# Patient Record
Sex: Female | Born: 1970 | Race: White | Hispanic: No | Marital: Single
Health system: Midwestern US, Community
[De-identification: ages and names within clinical notes are randomized; demographics above are authoritative.]

---

## 2009-06-11 ENCOUNTER — Emergency Department (HOSPITAL_COMMUNITY): Admission: EM | Admit: 2009-06-11 | Discharge: 2009-06-11 | Payer: Self-pay | Admitting: Family Medicine

## 2009-11-23 NOTE — ED Provider Notes (Signed)
KNOWN ALLERGIES   NKDA       TRIAGE   TRIAGE NOTES:  was running on treadmill and felt a snapping and         a pop and now having lots of right ankle swelling and pain skin warm         pink         dry resp even and unlabored. pt unable to bear weight on extremity.   PATIENT: NAME: Colleen Davenport, AGE: 39, GENDER: female, DOB:         Fri [birth year], TIME OF GREET: Sun Nov 23, 2009 14:07, SSN:         [id number], KG WEIGHT: 86.2, HEIGHT: 165cm, MEDICAL RECORD NUMBER:         [phone], ACCOUNT NUMBER: [id number], PCP: Mccurdy,Bruce,.   ADMISSION: URGENCY: 4, DEPT: Emergency, BED: WAITING.   VITAL SIGNS: BP 129/60, (Sitting), Pulse 63, Resp 20, Temp 96.7,         (Oral), Pain 9, O2 Sat 98, on Room air, Time 11/23/2009 14:09.   COMPLAINT:  Foot Injury.   LMP: Last menstrual period: 11/19/2009.   TB SCREENING: TB screen negative for this patient.   ABUSE SCREENING: Patient denies physical abuse or threats.   FALL RISK: Patient has a low risk of falling.   SUICIDAL IDEATION: Suicidal ideation is not present.   ADVANCE DIRECTIVES: Patient does not have advance directives,         Triage assessment performed.   PROVIDERS: TRIAGE NURSE: Georgeanne Nim, RN.       CURRENT MEDICATIONS   Wellbutrin:  150 mg Oral once a day.       MEDICATION SERVICE   Ibuprofen:  Order: Ibuprofen - Dose: 800 mg : Oral         Ordered by: Dixie Dials, MD         Entered by: Dixie Dials, MD Sun Nov 23, 2009 14:28 ,          Acknowledged by: Lucita Lora, RN Sun Nov 23, 2009 14:35         Documented as given by: Lucita Lora, RN Sun Nov 23, 2009 14:38          Patient, Medication, Dose, Route and Time verified prior to         administration.          Time given: 1440, Amount given: 800mg , Site: Medication administered         P.O., Correct patient, time, route, dose and medication confirmed         prior to administration, Patient advised of actions and side-effects         prior to administration, Allergies confirmed and medications reviewed          prior to administration.   Percocet 5/325:  Order: Percocet 5/325 (Acetaminophen/Oxycodone         Hydrochloride) - Dose: 325/5 mg : Oral         POTENTIAL SEVERE INTERACTION: Wellbutrin - Physician Discretion -         Benefits outweigh the risk         Ordered by: Dixie Dials, MD         Entered by: Dixie Dials, MD Sun Nov 23, 2009 14:28 ,          Acknowledged by: Lucita Lora, RN Sun Nov 23, 2009 14:35         Documented as given by: Lucita Lora, RN Wynelle Link  Nov 23, 2009 14:39          Patient, Medication, Dose, Route and Time verified prior to         administration.          Time given: 1440, Amount given: 1tab, Site: Medication administered         P.O., Correct patient, time, route, dose and medication confirmed         prior to administration, Patient advised of actions and side-effects         prior to administration, Allergies confirmed and medications reviewed         prior to administration.       INSTRUCTION   FOLLOWUP:  NEFF, ERIC [facility], [id number],         [place] [address], [phone], [facility] [place], , MEDICINE.   SPECIAL:  splint, ice, crutches, and pain medication as needed.         follow-up with orthopedics once home. return for increased pain or         other concerns.   Key:     BAF=Fickenscher, MD, Romeo Apple  JLP1=Guy, RN, Victorino Dike  JOHO=Hubbard, PA-C,     Amil Amen     KMC=Clinton, RN, Tresa Endo

## 2009-11-23 NOTE — ED Provider Notes (Signed)
[facility]   EMERGENCY DEPARTMENT TREATMENT REPORT   NAME:  Colleen Davenport   SEX:   F   ADMIT: 11/23/2009   DOB:   22-Dec-1970   MR#    [id number]   ROOM:     TIME SEEN: 03 11 PM   ACCT#  [id number]       cc: Scarlett Presto MD       CHIEF COMPLAINT:   Foot injury.       HISTORY OF PRESENT ILLNESS:   A 39 year old female who was running on the treadmill.  She states she heard a    pop, had immediate right foot pain and fell off the treadmill.  She denies    any injuries except her foot.  She states it hurts to move it.  She states    that the pain is essentially in the area of her Achilles and radiates into her    ankle.  She rates her pain as a 9 out of 10.  She has not taken anything for    it.       REVIEW OF SYSTEMS:   No knee pain, no hip pain.     NEUROLOGICALLY:  No neurologic symptoms.       PAST MEDICAL HISTORY:   None.       MEDICATIONS:   Wellbutrin.       ALLERGIES:   NONE.       PHYSICAL EXAMINATION:   VITAL SIGNS:  Blood pressure 129/60, pulse 63, respiratory rate 20,    temperature 96.7, O2 sat 98% on room air.   GENERAL APPEARANCE:  The patient appears well developed and well nourished.      Appearance and behavior are age and situation appropriate.   PULMONARY: She is not in respiratory distress.     NEUROLOGICALLY:  She is intact distally.   VASCULARLY:  She has 2+ dorsalis pedis and posterior tibial pulses.     There is significant tenderness to palpation of the Achilles and both    malleoli.  No tenderness in the foot.  Squeeze of her calf causes plantar    flexion of her foot, suggesting an intact Achilles tendon.       INITIAL ASSESSMENT:   A patient with an ankle injury, perhaps partial Achilles tear versus an ankle    sprain versus fracture.  This is a new problem for this patient.  Old records    were reviewed.  No additional relevant information was obtained.        CONTINUATION BY DR. FICKENSCHER:       RESULTS:     Ankle x-ray is negative.         MEDICAL DECISION MAKING:   The  patient likely has a partial Achilles tendon tear.  Will place her in a    posterior OCL, put her on crutches and refer to orthopedics.  She has been    given Percocet and ibuprofen here.  Discharged with a prescription for Vicodin    and ibuprofen.       DIAGNOSIS:   Acute partial Achilles tendon tear.           ___________________   Christiana Pellant MD   Dictated By: .        D:11/23/2009   T: 11/24/2009 12:15:00   [id number]

## 2010-01-06 NOTE — ED Provider Notes (Signed)
KNOWN ALLERGIES   NKDA       TRIAGE   TRIAGE NOTES:  (L) hand vs. van door.   PATIENT: NAME: Colleen Davenport, AGE: 39, GENDER: female,         DOB: Fri 1970/10/27, TIME OF GREET: Tue Jan 06, 2010 23:46, SSN:         [id number], KG WEIGHT: 86.2 (est.), HEIGHT: 165cm, MEDICAL RECORD         NUMBER: [phone], ACCOUNT NUMBER: [id number], PCP: Pt Denied,.   ADMISSION: URGENCY: 4, DEPT: Emergency, BED: WAITING.   VITAL SIGNS: BP 132/70, (Sitting), Pulse 60, Resp 18, Temp 97.2,         (Oral), Pain 9, O2 Sat 100, on Room air, Time 01/06/2010 23:56.   COMPLAINT:  Hurt Left Hand.   PRESENTING COMPLAINT:  Lt hand injury.   PAIN: Patient complains of pain, Pain described as throbbing, On         a scale 0-10 patient rates pain as 9, Pain is constant.   LMP: Last menstrual period: 12-06-2009.   TRIAGE CARE: Sling applied.   TB SCREENING: TB screen negative for this patient.   ABUSE SCREENING: Patient denies physical abuse or threats.   FALL RISK: Fall risk assessment not applicable to this patient.   SUICIDAL IDEATION: Suicidal ideation is not present.   ADVANCE DIRECTIVES: Patient does not have advance directives,         Triage assessment performed.   PROVIDERS: TRIAGE NURSE: Harolyn Rutherford, RN.   PREVIOUS VISIT ALLERGIES: Nkda.       CURRENT MEDICATIONS   Wellbutrin:  150 mg Oral once a day.       MEDICATION SERVICE   Percocet 5/325:  Order: Percocet 5/325 (Acetaminophen/Oxycodone         Hydrochloride) - Dose: 325/5 mg : Oral         POTENTIAL SEVERE INTERACTION: Wellbutrin - Low risk interaction         Notes: 2 po         Ordered by: Hilaria Ota, PA-C         Entered by: Hilaria Ota, PA-C Wed Jan 07, 2010 00:12          Documented as given by: Odessa Fleming, RN Wed Jan 07, 2010 00:33          Patient, Medication, Dose, Route and Time verified prior to         administration.          Time given: 0034, Amount given: 2 tabs, Site: Medication         administered P.O., Correct patient, time, route, dose and  medication         confirmed prior to administration, Patient advised of actions and         side-effects prior to administration, Allergies confirmed and         medications reviewed prior to administration, Administered by         PSmith,RN, Patient in position of comfort, Side rails up, Cart in         lowest position, Call light in reach.       INSTRUCTION   DISCHARGE:  CONTUSION (BRUISE, ECCHYMOSIS).   FOLLOWUP:  [facility], , MEDICINE, HADEN, DAVID S, GMD, [address]         [address], [place] [address], [phone].   SPECIAL:  Xrays will be read by radiologist in 24 hrs. You will  be notified of any change.         Follow up with primary care physician         Take 600 mg (3 tabs) ibuprofen OTC 3 times daily for inflammation         Ice and elevate.   Key:     KM5=Mullaney, RN, Tresa Endo  NVS=Santiago, PA-C, Delton See  PTS0=Smith, RN,     [employer]

## 2010-01-06 NOTE — ED Provider Notes (Signed)
[facility]   EMERGENCY DEPARTMENT TREATMENT REPORT   NAME:  Colleen Davenport   SEX:   F   ADMIT: 01/06/2010   DOB:   21-Mar-1970   MR#    [id number]   ROOM:     TIME SEEN: 04 26 AM   ACCT#  [id number]               CHIEF COMPLAINT:   Left hand injury.       HISTORY OF PRESENT ILLNESS:   A 39 year old female who arrived to the Emergency Department, apparently her    son accidentally closed the door on her and her left hand is bruised and    painful, is able to move it, but says it hurts.  No wrist injury.       REVIEW OF SYSTEMS:   Moderate bruising to the dorsal aspect of the left hand and painful to move.     No other injuries.  Denies complaints in all other systems.         PAST MEDICAL HISTORY:   Depression.       FAMILY HISTORY:    Negative.        SOCIAL HISTORY:   Negative.       ALLERGIES:   NO KNOWN DRUG ALLERGIES.       MEDICATIONS:   Wellbutrin.       PHYSICAL EXAMINATION:   VITAL SIGNS:  Blood pressure 132/70, pulse 60, respirations 18, temperature    97.0, pain 9 out of 10, O2 saturation 100%.     MUSCULOSKELETAL:  Examination of patient's left hand, dorsal aspect of the    left hand has increased bruising and swelling.  She is able to flex and extend    it.       COURSE IN THE EMERGENCY DEPARTMENT:      X-rays were done, they were negative.       FINAL DIAGNOSIS:   Acute left hand contusion.         DISPOSITION AND PLAN:      The patient was sent home on Percocet and Norvasc.  The patient was personally    evaluated by myself and Dr. Erlinda Hong who agrees with the above    assessment and plan.           ___________________   Erlinda Hong MD   Dictated By: Hilaria Ota, PA-C   tc   D:01/08/2010   T: 01/08/2010 08:51:06   [id number]

## 2010-01-07 LAB — URINALYSIS W/ RFLX MICROSCOPIC
Bilirubin: NEGATIVE
Blood: NEGATIVE
Glucose: NEGATIVE MG/DL
Ketone: NEGATIVE MG/DL
Leukocyte Esterase: NEGATIVE
Nitrites: NEGATIVE
Protein: NEGATIVE MG/DL
Specific gravity: 1.01 (ref 1.003–1.030)
Urobilinogen: 0.2 EU/DL (ref 0.2–1.0)
pH (UA): 5.5 (ref 5.0–8.0)

## 2010-01-07 LAB — HCG URINE, QL: HCG urine, QL: NEGATIVE

## 2011-08-26 ENCOUNTER — Emergency Department: Payer: Self-pay | Admitting: Emergency Medicine

## 2011-08-26 LAB — COMPREHENSIVE METABOLIC PANEL
Alkaline Phosphatase: 62 U/L (ref 50–136)
Anion Gap: 11 (ref 7–16)
Calcium, Total: 8.2 mg/dL — ABNORMAL LOW (ref 8.5–10.1)
Chloride: 111 mmol/L — ABNORMAL HIGH (ref 98–107)
Co2: 22 mmol/L (ref 21–32)
EGFR (African American): >60
EGFR (Non-African Amer.): >60
Osmolality: 285 (ref 275–301)
Potassium: 3.6 mmol/L (ref 3.5–5.1)
Sodium: 144 mmol/L (ref 136–145)

## 2011-08-26 LAB — TSH: Thyroid Stimulating Horm: 0.461 u[IU]/mL

## 2011-08-26 LAB — CBC
Platelet: 264 10*3/uL (ref 150–440)
RBC: 4.27 10*6/uL (ref 3.80–5.20)
WBC: 9.3 10*3/uL (ref 3.6–11.0)

## 2011-08-26 LAB — URINALYSIS, COMPLETE
Bacteria: NONE SEEN
Blood: NEGATIVE
Glucose,UR: NEGATIVE mg/dL (ref 0–75)
Ketone: NEGATIVE
Specific Gravity: 1.002 (ref 1.003–1.030)
Squamous Epithelial: NONE SEEN
WBC UR: NONE SEEN /HPF (ref 0–5)

## 2011-08-26 LAB — DRUG SCREEN, URINE
Barbiturates, Ur Screen: NEGATIVE (ref ?–200)
Cannabinoid 50 Ng, Ur ~~LOC~~: NEGATIVE (ref ?–50)
Cocaine Metabolite,Ur ~~LOC~~: NEGATIVE (ref ?–300)
Methadone, Ur Screen: NEGATIVE (ref ?–300)
Tricyclic, Ur Screen: NEGATIVE (ref ?–1000)

## 2011-08-26 LAB — ETHANOL
Ethanol %: 0.201 % — ABNORMAL HIGH (ref 0.000–0.080)
Ethanol: 201 mg/dL

## 2011-08-27 ENCOUNTER — Emergency Department (HOSPITAL_COMMUNITY)
Admission: EM | Admit: 2011-08-27 | Discharge: 2011-08-29 | Disposition: A | Discharge status: Other Acute Inpatient Hospital | Payer: Self-pay | Attending: Emergency Medicine | Admitting: Emergency Medicine

## 2011-08-27 ENCOUNTER — Encounter (HOSPITAL_COMMUNITY): Payer: Self-pay | Admitting: *Deleted

## 2011-08-27 DIAGNOSIS — F1010 Alcohol abuse, uncomplicated: Secondary | ICD-10-CM | POA: Insufficient documentation

## 2011-08-27 LAB — COMPREHENSIVE METABOLIC PANEL
AST: 22 U/L (ref 0–37)
Albumin: 3.7 g/dL (ref 3.5–5.2)
Calcium: 9.1 mg/dL (ref 8.4–10.5)
Creatinine, Ser: 0.98 mg/dL (ref 0.50–1.10)
Total Protein: 7 g/dL (ref 6.0–8.3)

## 2011-08-27 LAB — CBC WITH DIFFERENTIAL/PLATELET
Basophils Absolute: 0.1 10*3/uL (ref 0.0–0.1)
Basophils Relative: 1 % (ref 0–1)
Eosinophils Absolute: 0.6 10*3/uL (ref 0.0–0.7)
Eosinophils Relative: 5 % (ref 0–5)
HCT: 37.8 % (ref 36.0–46.0)
MCHC: 35.4 g/dL (ref 30.0–36.0)
MCV: 85.5 fL (ref 78.0–100.0)
Monocytes Absolute: 0.9 10*3/uL (ref 0.1–1.0)
Neutro Abs: 6.3 10*3/uL (ref 1.7–7.7)
RDW: 12.7 % (ref 11.5–15.5)

## 2011-08-27 LAB — URINALYSIS, ROUTINE W REFLEX MICROSCOPIC
Glucose, UA: NEGATIVE mg/dL
Ketones, ur: NEGATIVE mg/dL
pH: 5.5 (ref 5.0–8.0)

## 2011-08-27 LAB — RAPID URINE DRUG SCREEN, HOSP PERFORMED
Barbiturates: NOT DETECTED
Benzodiazepines: POSITIVE — AB
Cocaine: NOT DETECTED

## 2011-08-27 LAB — ETHANOL: Alcohol, Ethyl (B): 264 mg/dL — ABNORMAL HIGH (ref 0–11)

## 2011-08-27 LAB — URINE MICROSCOPIC-ADD ON

## 2011-08-27 NOTE — ED Notes (Signed)
The pt wants to be detoxed from xanax and alcohol .  She last used both for 2 hours ago.. lmp due now

## 2011-08-27 NOTE — ED Provider Notes (Signed)
History     CSN: [id number]  Arrival date & time 08/27/11  2111   First MD Initiated Contact with Patient 08/27/11 2308      Chief Complaint  Patient presents with  . detox     (Consider location/radiation/quality/duration/timing/severity/associated sxs/prior treatment) Patient is a 41 y.o. female presenting with drug/alcohol assessment. The history is provided by the patient.  Drug / Alcohol Assessment Primary symptoms comment: wants detox This is a chronic problem. The current episode started more than 1 week ago. The problem has not changed since onset.Suspected agents include alcohol. Associated medical issues include addiction treatment. Associated medical issues do not include withdrawal syndrome.    History reviewed. No pertinent past medical history.  History reviewed. No pertinent past surgical history.  No family history on file.  History  Substance Use Topics  . Smoking status: Never Smoker   . Smokeless tobacco: Not on file  . Alcohol Use: Yes    OB History    Grav Para Term Preterm Abortions TAB SAB Ect Mult Living                  Review of Systems  All other systems reviewed and are negative.    Allergies  Review of patient's allergies indicates no known allergies.  Home Medications   Current Outpatient Rx  Name Route Sig Dispense Refill  . BUPROPION HCL ER (SR) 150 MG PO TB12 Oral Take 150 mg by mouth 2 (two) times daily.    Marland Kitchen ESCITALOPRAM OXALATE 20 MG PO TABS Oral Take 20 mg by mouth daily.      BP 120/74  Temp 98.2 F (36.8 C) (Oral)  Resp 20  SpO2 96%  LMP 07/27/2011  Physical Exam  Constitutional: She is oriented to person, place, and time. She appears well-developed and well-nourished.  HENT:  Head: Normocephalic and atraumatic.  Eyes: Conjunctivae and EOM are normal. Pupils are equal, round, and reactive to light.  Neck: Normal range of motion.  Cardiovascular: Normal rate, regular rhythm and normal heart sounds.     Pulmonary/Chest: Effort normal and breath sounds normal.  Abdominal: Soft. Bowel sounds are normal.  Musculoskeletal: Normal range of motion.  Neurological: She is alert and oriented to person, place, and time.  Skin: Skin is warm and dry.       Diffuse excoriated lesions to arms and face  Psychiatric: She has a normal mood and affect. Her behavior is normal.    ED Course  Procedures (including critical care time)  Labs Reviewed  URINALYSIS, ROUTINE W REFLEX MICROSCOPIC - Abnormal; Notable for the following:    APPearance CLOUDY (*)     Hgb urine dipstick LARGE (*)     Leukocytes, UA MODERATE (*)     All other components within normal limits  URINE RAPID DRUG SCREEN (HOSP PERFORMED) - Abnormal; Notable for the following:    Benzodiazepines POSITIVE (*)     All other components within normal limits  CBC WITH DIFFERENTIAL - Abnormal; Notable for the following:    WBC 11.6 (*)     All other components within normal limits  COMPREHENSIVE METABOLIC PANEL - Abnormal; Notable for the following:    Potassium 3.3 (*)     Glucose, Bld 110 (*)     Total Bilirubin 0.2 (*)     GFR calc non Af Amer 72 (*)     GFR calc Af Amer 83 (*)     All other components within normal limits  URINE MICROSCOPIC-ADD ON -  Abnormal; Notable for the following:    Squamous Epithelial / LPF FEW (*)     Bacteria, UA FEW (*)     All other components within normal limits  PREGNANCY, URINE  ETHANOL   No results found.   No diagnosis found.    MDM  Pt presents for detox evaluation.  Will have seen by behavioral health counselor         Rosanne Ashing, MD 08/27/11 [phone]

## 2011-08-28 MED ORDER — HYDROCORTISONE 1 % EX CREA
TOPICAL_CREAM | Freq: Two times a day (BID) | CUTANEOUS | Status: DC
  Administered 2011-08-28: 17:00:00 via TOPICAL
  Filled 2011-08-28 (×2): qty 28

## 2011-08-28 MED ORDER — SULFAMETHOXAZOLE-TMP DS 800-160 MG PO TABS
1.0000 | ORAL_TABLET | Freq: Once | ORAL | Status: AC
  Administered 2011-08-28: 1 via ORAL
  Filled 2011-08-28: qty 1

## 2011-08-28 MED ORDER — ADULT MULTIVITAMIN W/MINERALS CH
1.0000 | ORAL_TABLET | Freq: Every day | ORAL | Status: DC
  Administered 2011-08-28: 1 via ORAL
  Filled 2011-08-28: qty 1

## 2011-08-28 MED ORDER — ESCITALOPRAM OXALATE 20 MG PO TABS
20.0000 mg | ORAL_TABLET | Freq: Every day | ORAL | Status: DC
  Administered 2011-08-28: 20 mg via ORAL
  Filled 2011-08-28 (×2): qty 1

## 2011-08-28 MED ORDER — THIAMINE HCL 100 MG/ML IJ SOLN: 100.0000 mg | Freq: Every day | INTRAMUSCULAR | Status: DC

## 2011-08-28 MED ORDER — LORAZEPAM 1 MG PO TABS
1.0000 mg | ORAL_TABLET | Freq: Four times a day (QID) | ORAL | Status: DC | PRN
  Administered 2011-08-28 (×2): 1 mg via ORAL
  Filled 2011-08-28 (×2): qty 1

## 2011-08-28 MED ORDER — VITAMIN B-1 100 MG PO TABS
100.0000 mg | ORAL_TABLET | Freq: Every day | ORAL | Status: DC
  Administered 2011-08-28: 100 mg via ORAL
  Filled 2011-08-28: qty 1

## 2011-08-28 MED ORDER — HYDROXYZINE HCL 25 MG PO TABS
25.0000 mg | ORAL_TABLET | Freq: Once | ORAL | Status: AC
  Administered 2011-08-28: 25 mg via ORAL

## 2011-08-28 MED ORDER — FOLIC ACID 1 MG PO TABS
1.0000 mg | ORAL_TABLET | Freq: Every day | ORAL | Status: DC
  Administered 2011-08-28: 1 mg via ORAL
  Filled 2011-08-28: qty 1

## 2011-08-28 MED ORDER — LORAZEPAM 2 MG/ML IJ SOLN: 1.0000 mg | Freq: Four times a day (QID) | INTRAMUSCULAR | Status: DC | PRN

## 2011-08-28 MED ORDER — DEXAMETHASONE SODIUM PHOSPHATE 10 MG/ML IJ SOLN
10.0000 mg | Freq: Once | INTRAMUSCULAR | Status: AC
  Administered 2011-08-28: 10 mg via INTRAMUSCULAR
  Filled 2011-08-28: qty 1

## 2011-08-28 MED ORDER — DIPHENHYDRAMINE HCL 25 MG PO CAPS
25.0000 mg | ORAL_CAPSULE | ORAL | Status: DC | PRN
  Administered 2011-08-28: 25 mg via ORAL
  Filled 2011-08-28: qty 1

## 2011-08-28 MED ORDER — BUPROPION HCL ER (SR) 150 MG PO TB12
150.0000 mg | ORAL_TABLET | Freq: Two times a day (BID) | ORAL | Status: DC
  Administered 2011-08-28: 150 mg via ORAL
  Filled 2011-08-28 (×2): qty 1

## 2011-08-28 NOTE — ED Notes (Signed)
Patient has rash covering body, complaints of itching. Hydrocortisone applied.

## 2011-08-28 NOTE — ED Notes (Signed)
ACT TEAM at bedside for assessment.

## 2011-08-28 NOTE — BH Assessment (Addendum)
Assessment Note   Amber Ramos is an 41 y.o. female who presents seeking detox from alcohol and heroin.  She reports drinking a bottle of wine daily and snorting 2-3 capsules of heroin a few times a week.  She denies SI, Hi, and Psychosis.  This is her first time seeking treatment for substance abuse and she reports she is motivated to get her son back.  She prefers to go to [facility] in [place]. Pt was referred to [facility] as well as [facility]; pending disposition.  Axis I: Alcohol Dependence, Opioid Abuse Axis II: Deferred Axis III: History reviewed. No pertinent past medical history. Axis IV: problems with primary support group Axis V: 31-40 impairment in reality testing  Past Medical History: History reviewed. No pertinent past medical history.  History reviewed. No pertinent past surgical history.  Family History: No family history on file.  Social History:  reports that she has never smoked. She does not have any smokeless tobacco history on file. She reports that she drinks alcohol. Her drug history not on file.  Additional Social History:  Alcohol / Drug Use History of alcohol / drug use?: Yes Substance #1 Name of Substance 1: Wine 1 - Age of First Use: 30 1 - Amount (size/oz): bottle of wine 1 - Frequency: daily 1 - Duration: 8 years 1 - Last Use / Amount: 08/27/11 9pm/ 1 beer  Substance #2 Name of Substance 2: Heroin 2 - Age of First Use: 37 2 - Amount (size/oz): 2-3 capsules snorted 2 - Frequency: few times weekly 2 - Duration: 2 years 2 - Last Use / Amount: 08/23/11 2-3 capsules  CIWA: CIWA-Ar BP: 100/52 mmHg Pulse Rate: 72  Nausea and Vomiting: 3 Tactile Disturbances: mild itching, pins and needles, burning or numbness Tremor: not visible, but can be felt fingertip to fingertip Auditory Disturbances: not present Paroxysmal Sweats: barely perceptible sweating, palms moist Visual Disturbances: very mild sensitivity Anxiety: no anxiety, at ease Headache, Fullness in Head:  very mild Agitation: normal activity Orientation and Clouding of Sensorium: oriented and can do serial additions CIWA-Ar Total: 9  COWS: Clinical Opiate Withdrawal Scale (COWS) Resting Pulse Rate: Pulse Rate 80 or below Sweating: Subjective report of chills or flushing Restlessness: Able to sit still Pupil Size: Pupils possibly larger than normal for room light Bone or Joint Aches: Not present Runny Nose or Tearing: Not present GI Upset: nausea or loose stool Tremor: No tremor Yawning: No yawning Anxiety or Irritability: Patient obviously irritable/anxious Gooseflesh Skin: Skin is smooth COWS Total Score: 6   Allergies: No Known Allergies  Home Medications:  (Not in a hospital admission)  OB/GYN Status:  Patient's last menstrual period was 07/27/2011.  General Assessment Data Location of Assessment: [facility] ED Living Arrangements: Alone Can pt return to current living arrangement?: Yes Admission Status: Voluntary Is patient capable of signing voluntary admission?: Yes Transfer from: Acute Hospital Referral Source: Self/Family/Friend  Education Status Is patient currently in school?: No Highest grade of school patient has completed: masters degree  Risk to self Suicidal Ideation: No Suicidal Intent: No Is patient at risk for suicide?: No Suicidal Plan?: No Access to Means: No What has been your use of drugs/alcohol within the last 12 months?: ongoing Previous Attempts/Gestures: No Intentional Self Injurious Behavior:  (drinking) Family Suicide History: No Persecutory voices/beliefs?: No Depression: No Substance abuse history and/or treatment for substance abuse?: Yes Suicide prevention information given to non-admitted patients: Not applicable  Risk to Others Homicidal Ideation: No Thoughts of Harm to Others: No  Current Homicidal Intent: No Current Homicidal Plan: No Access to Homicidal Means: No History of harm to others?: No Assessment of Violence: None  Noted Does patient have access to weapons?: No Criminal Charges Pending?: No Does patient have a court date: No  Psychosis Hallucinations: None noted Delusions: None noted  Mental Status Report Appear/Hygiene: Disheveled Eye Contact: Good Motor Activity: Freedom of movement Speech: Logical/coherent;Pressured;Slurred Level of Consciousness: Alert Mood: Anxious Affect: Appropriate to circumstance Anxiety Level: Moderate Thought Processes: Coherent;Relevant Judgement: Unimpaired Orientation: Person;Place;Time;Situation Obsessive Compulsive Thoughts/Behaviors: Moderate  Cognitive Functioning Concentration: Decreased Memory: Remote Intact;Recent Intact IQ: Average Insight: Poor Impulse Control: Poor Appetite: Good Sleep: Decreased Total Hours of Sleep:  (couple times a week) Vegetative Symptoms: None  ADLScreening [facility] Assessment Services) Patient's cognitive ability adequate to safely complete daily activities?: Yes Patient able to express need for assistance with ADLs?: Yes  Abuse/Neglect [facility]) Physical Abuse: Denies Verbal Abuse: Denies Sexual Abuse: Denies        ADL Screening (condition at time of admission) Patient's cognitive ability adequate to safely complete daily activities?: Yes Patient able to express need for assistance with ADLs?: Yes       Abuse/Neglect Assessment (Assessment to be complete while patient is alone) Physical Abuse: Denies Verbal Abuse: Denies Sexual Abuse: Denies Exploitation of patient/patient's resources: Denies     Merchant [employer] officer (For Healthcare) Advance Directive: Patient does not have advance directive Nutrition Screen Diet: Regular Unintentional weight loss greater than 10lbs within the last month: No Problems chewing or swallowing foods and/or liquids: No Home Tube Feeding or Total Parenteral Nutrition (TPN): No  Additional Information 1:1 In Past 12 Months?: No CIRT Risk: No Elopement Risk: No Does patient  have medical clearance?: No     Disposition:  Disposition Disposition of Patient: Inpatient treatment program;Referred to Type of inpatient treatment program: Adult Patient referred to: [facility]  On Site Evaluation by:   Reviewed with Physician:     Steward Ros 08/28/2011 5:14 AM

## 2011-08-28 NOTE — ED Notes (Signed)
Report to Chris RN.

## 2011-08-28 NOTE — ED Provider Notes (Signed)
Pt complaining of diffuse itchy rash.  Has raised erythematous lesion with vesicles to both arms/trunck.  Looks like poison ivy.  Pt says that she slept in woods a couple of days ago.  Will give decadron, atarax.  Still awaiting placement to detox  Rolan Bucco, MD 08/28/11 1232

## 2011-08-28 NOTE — ED Notes (Signed)
Dinner tray given to patient

## 2011-08-29 NOTE — ED Provider Notes (Signed)
0145.  No acute events reported.  Informed pt has been accepted to [facility].  Will review her ER evaluation, if no issues noted plan D/C to be taken to [facility].  Tobin Chad, MD 08/29/11 [phone]

## 2011-08-29 NOTE — ED Notes (Signed)
Pt states understanding of discharge instructions 

## 2011-09-04 ENCOUNTER — Emergency Department: Payer: Self-pay | Admitting: Emergency Medicine

## 2011-10-11 IMAGING — CR DG ANKLE COMPLETE 3+V*R*
3 series · 3 of 3 positions shown · non-contrast
Comparison: None.

CLINICAL DATA: The posterior ankle pain following injury

RIGHT ANKLE - COMPLETE 3+ VIEW

[view not recorded (1 of 3)]
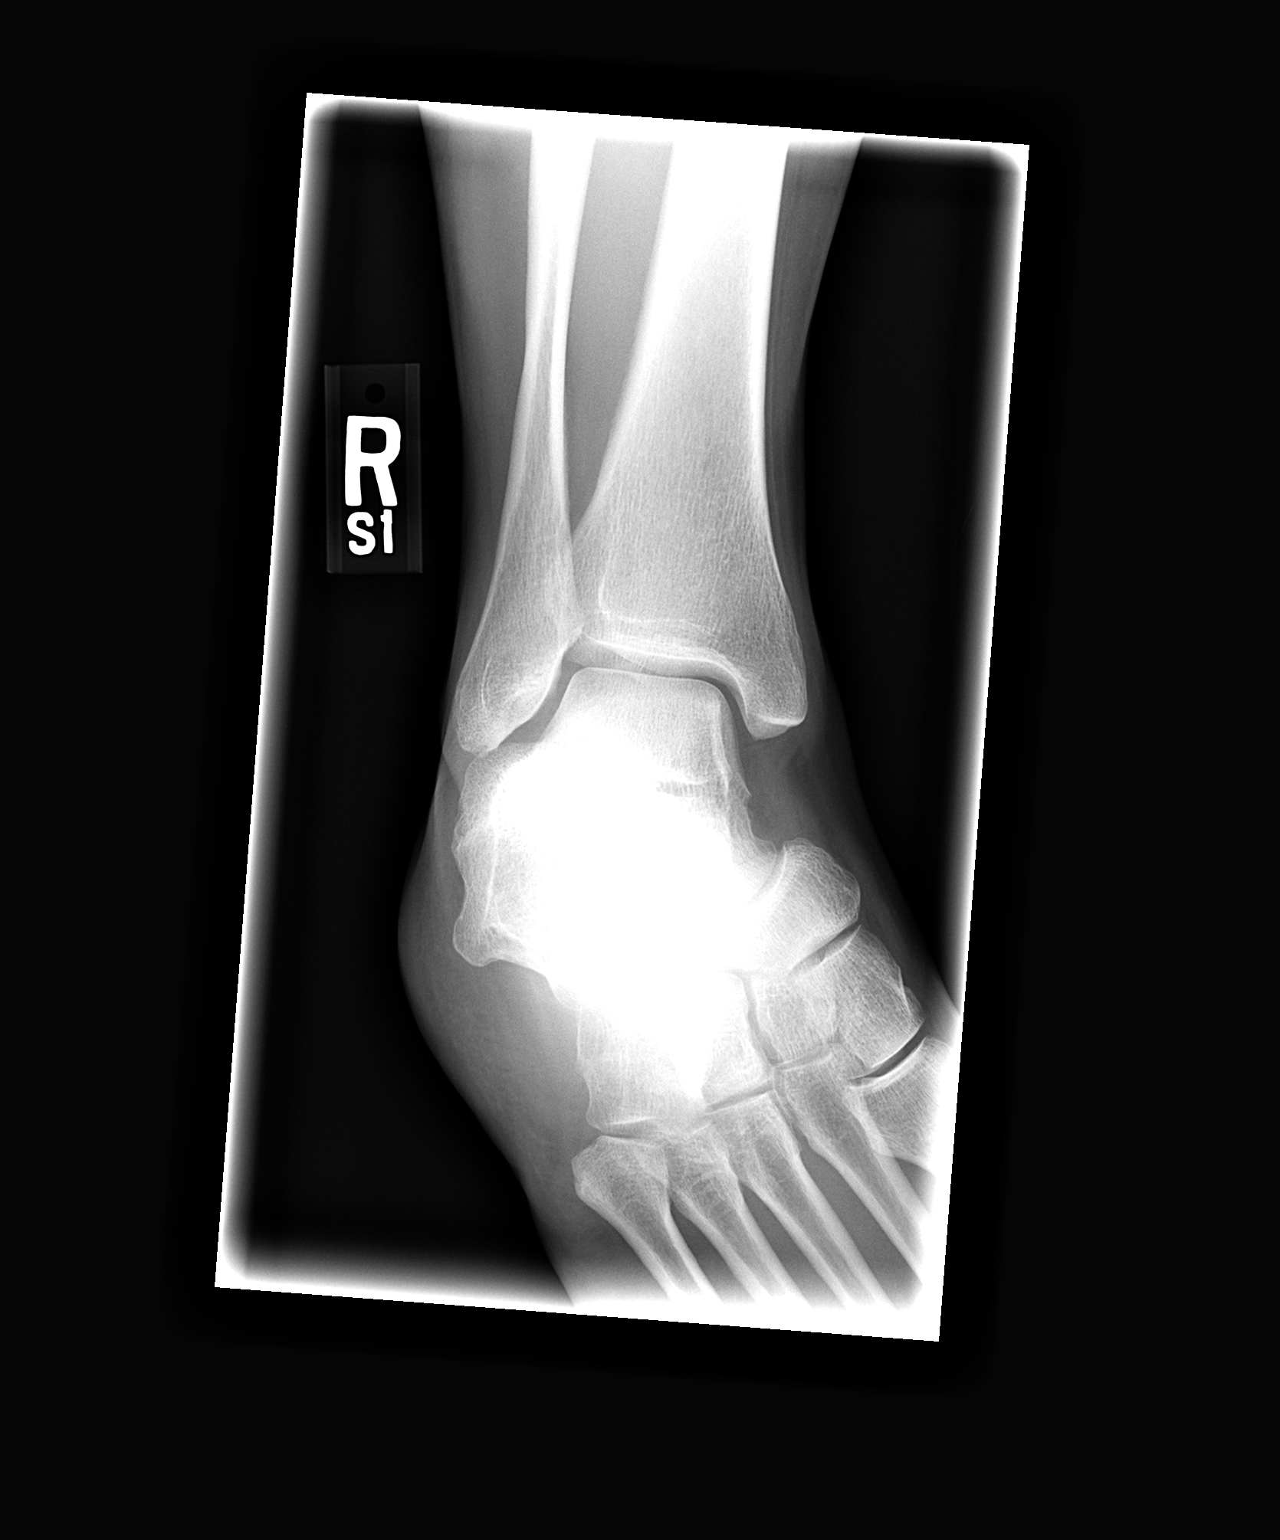

[view not recorded (2 of 3)]
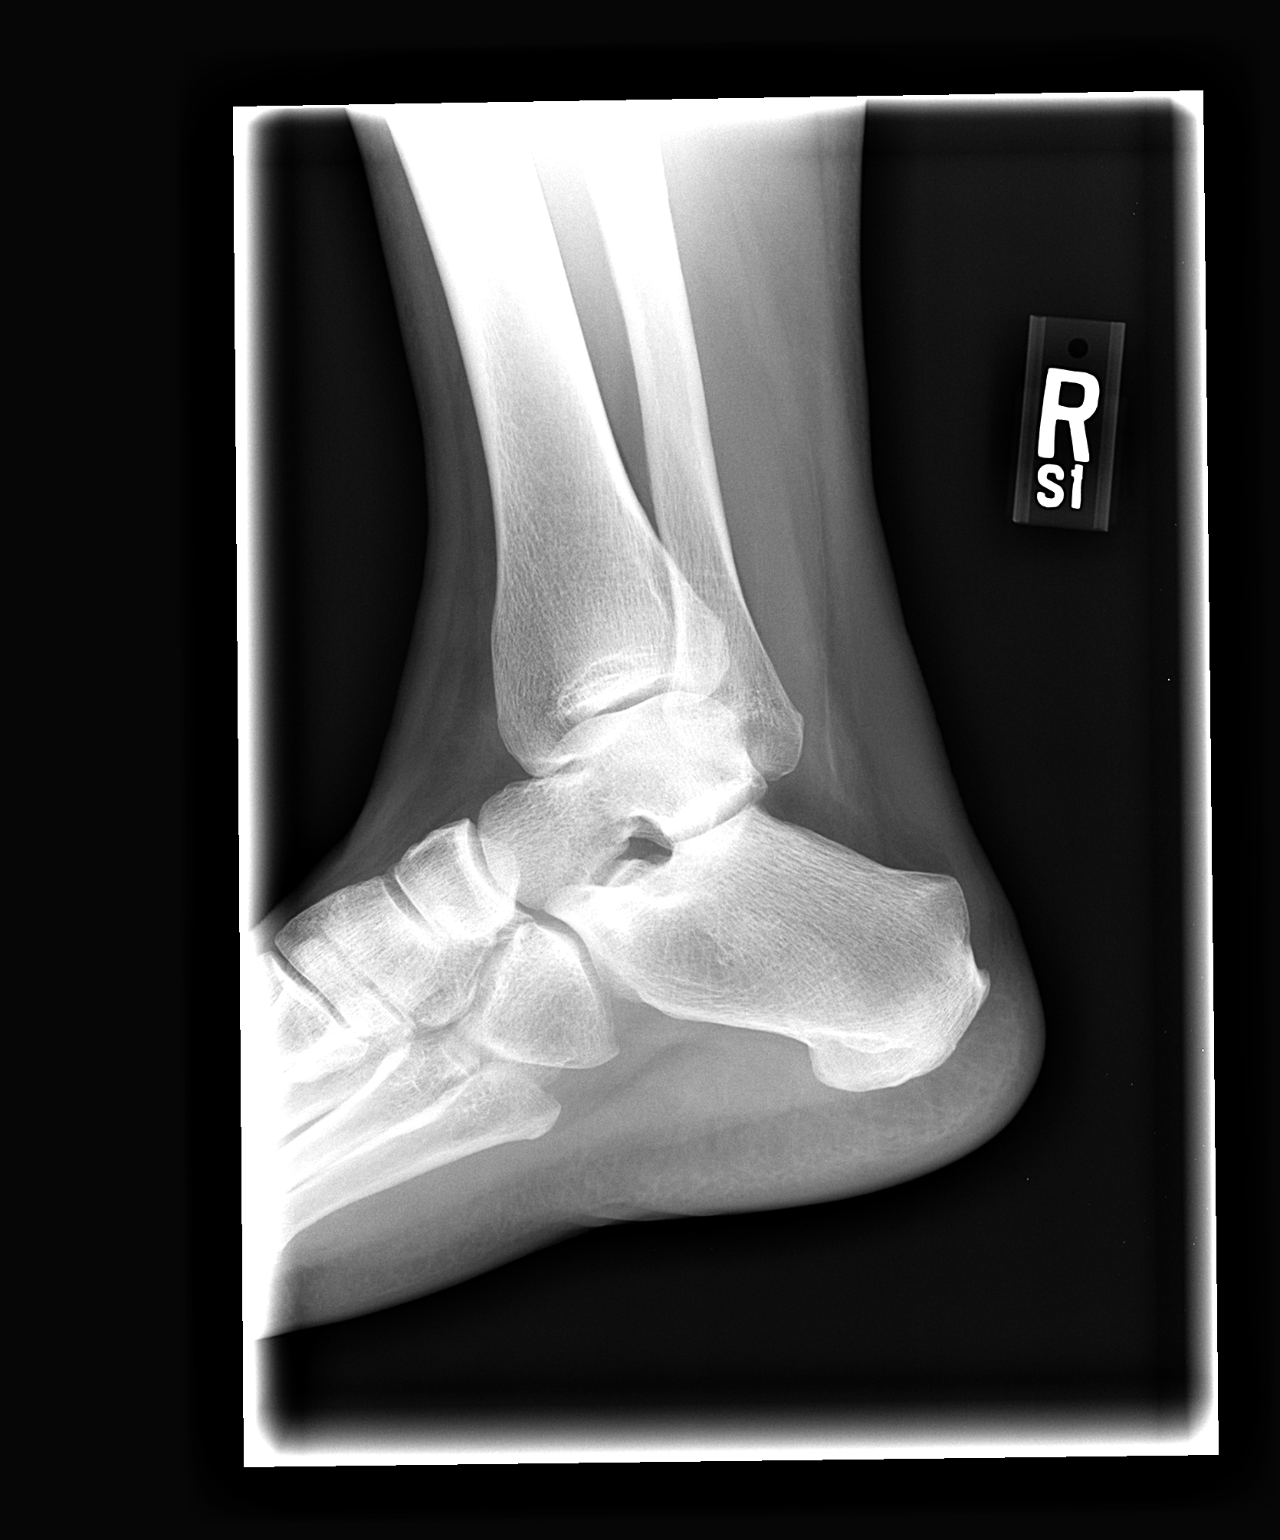

[view not recorded (3 of 3)]
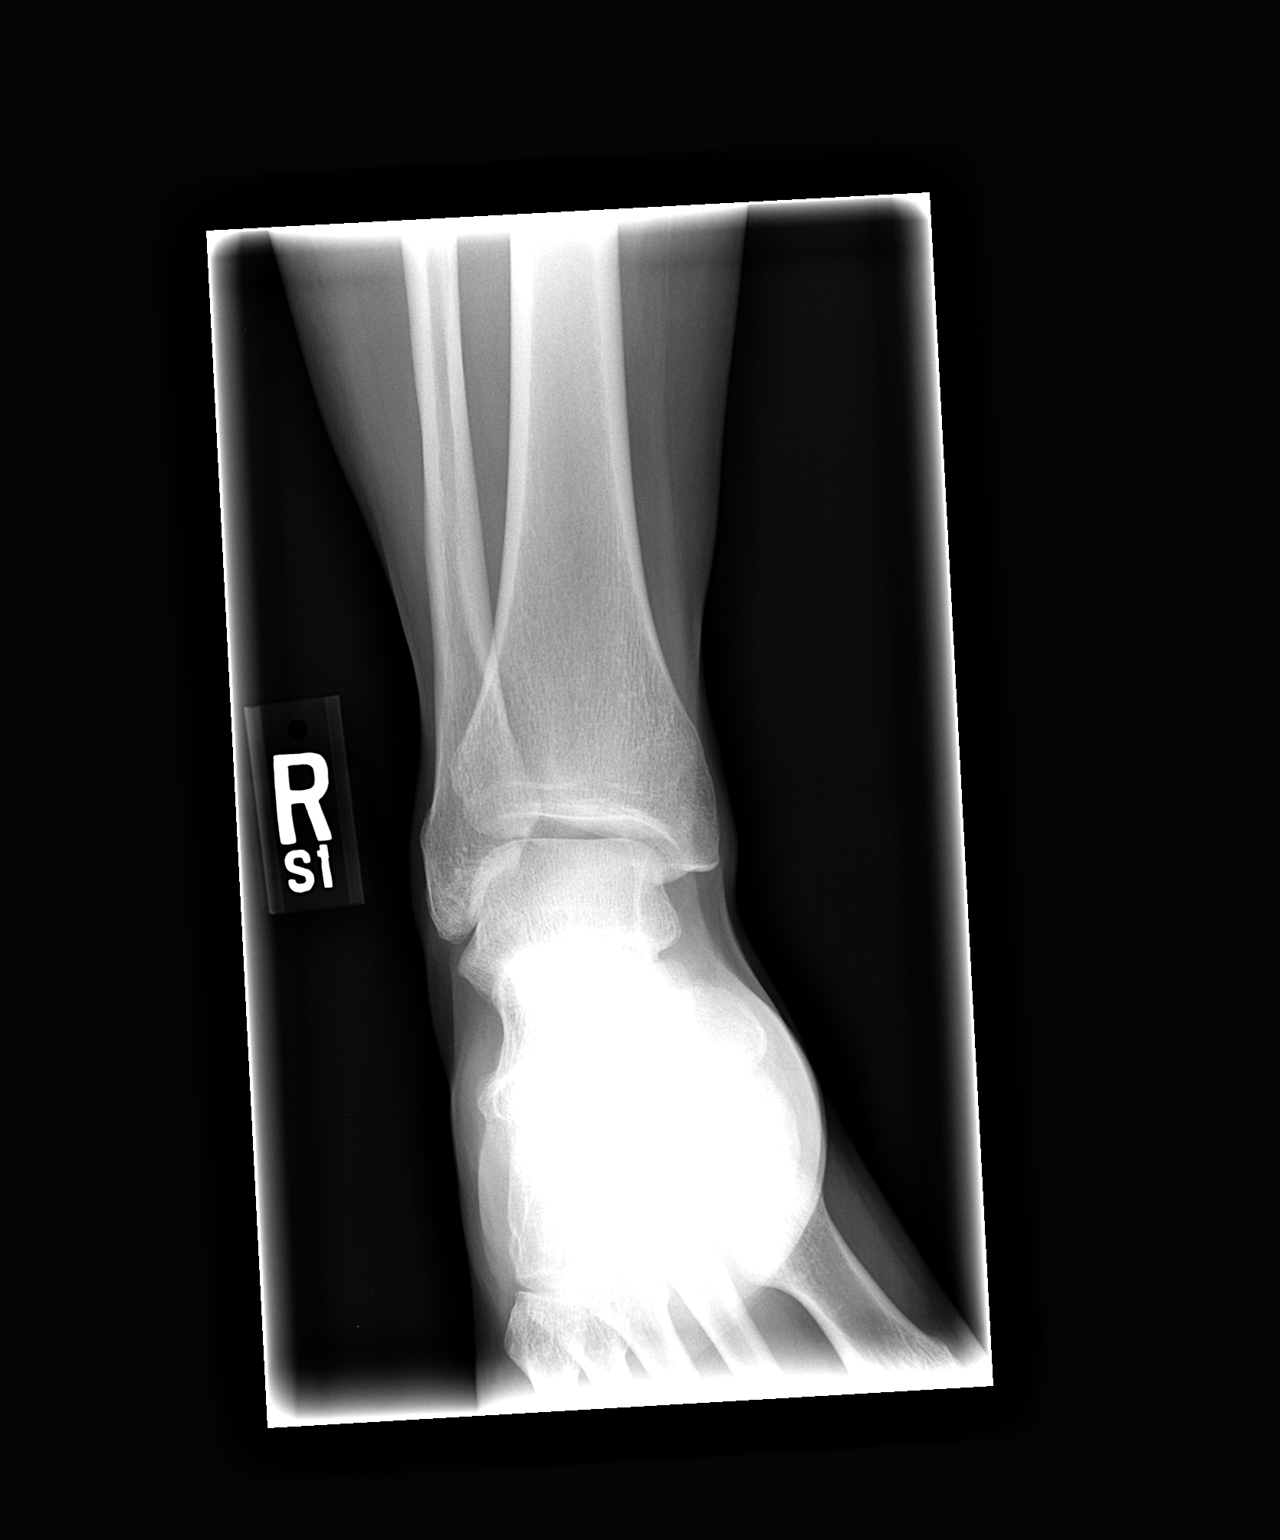

[3 of 3 positions shown; findings below may reference images not displayed]

FINDINGS: No fracture or dislocation.  No foreign body or other
abnormality of the soft tissues.
IMPRESSION: No acute or significant findings.

## 2011-10-19 LAB — CBC WITH AUTOMATED DIFF
ABS. BASOPHILS: 0 10*3/uL (ref 0.0–0.2)
ABS. EOSINOPHILS: 0.1 10*3/uL (ref 0.0–0.7)
ABS. LYMPHOCYTES: 1.6 10*3/uL (ref 1.2–3.4)
ABS. MONOCYTES: 0.8 10*3/uL — ABNORMAL LOW (ref 1.1–3.2)
ABS. NEUTROPHILS: 6.1 10*3/uL (ref 1.4–6.5)
BASOPHILS: 0 % (ref 0–2)
EOSINOPHILS: 1 % (ref 0–5)
HCT: 33.2 % — ABNORMAL LOW (ref 34.0–42.2)
HGB: 11.8 g/dL (ref 11.8–14.2)
IMMATURE GRANULOCYTES: 0.1 % (ref 0.0–5.0)
LYMPHOCYTES: 19 % (ref 16–40)
MCH: 31.3 PG — ABNORMAL HIGH (ref 27–31)
MCHC: 35.5 g/dL (ref 32–36)
MCV: 88.1 FL (ref 81–99)
MONOCYTES: 10 % (ref 0–12)
MPV: 10.1 FL (ref 7.4–10.4)
NEUTROPHILS: 70 % (ref 40–70)
PLATELET: 201 10*3/uL (ref 140–450)
RBC: 3.77 M/uL — ABNORMAL LOW (ref 4.0–5.2)
RDW: 13 % (ref 11.5–14.5)
WBC: 8.6 10*3/uL (ref 4.8–10.8)

## 2011-10-19 LAB — METABOLIC PANEL, BASIC
Anion gap: 10 mmol/L (ref 10–17)
BUN/Creatinine ratio: 9 (ref 6.0–20.0)
BUN: 8 MG/DL (ref 7–18)
CO2: 31 MMOL/L (ref 21–32)
Calcium: 8.9 MG/DL (ref 8.5–10.1)
Chloride: 100 MMOL/L (ref 98–107)
Creatinine: 0.9 MG/DL (ref 0.6–1.3)
GFR est AA: >60 mL/min/{1.73_m2} (ref >60)
GFR est non-AA: >60 mL/min/{1.73_m2} (ref >60)
Glucose: 103 MG/DL (ref 74–106)
Potassium: 4.2 MMOL/L (ref 3.50–5.10)
Sodium: 137 MMOL/L (ref 136–145)

## 2011-10-19 LAB — CARDIAC BATTERY
CK-MB: 3.5 ng/mL (ref <4.3)
CK-MB: 3.3 ng/mL (ref <4.3)
Myoglobin: 66 ng/mL (ref <107)
Myoglobin: 64 ng/mL (ref <107)
Troponin-I: <0.05 ng/mL (ref <0.05)
Troponin-I: <0.05 ng/mL (ref <0.05)

## 2011-10-19 LAB — HEPATIC FUNCTION PANEL
A-G Ratio: 1.3 (ref 1.0–3.1)
ALT (SGPT): 27 U/L (ref 12.0–78.0)
AST (SGOT): 24 U/L (ref 15–37)
Albumin: 3.8 g/dL (ref 3.40–5.00)
Alk. phosphatase: 83 U/L (ref 50–136)
Bilirubin, direct: 0.1 MG/DL (ref 0.0–0.20)
Bilirubin, total: 0.4 MG/DL (ref 0.20–1.00)
Globulin: 3 g/dL
Protein, total: 6.8 g/dL (ref 6.40–8.20)

## 2011-10-19 LAB — CK
CK: 346 U/L — ABNORMAL HIGH (ref 26–308)
CK: 307 U/L (ref 26–308)

## 2011-10-19 LAB — MAGNESIUM: Magnesium: 1.7 MG/DL — ABNORMAL LOW (ref 1.8–2.4)

## 2011-10-19 LAB — PHOSPHORUS: Phosphorus: 3.5 MG/DL (ref 2.50–4.90)

## 2011-10-19 LAB — PROTHROMBIN TIME + INR
INR: 1
Prothrombin time: 10.2 s (ref 9.8–12.0)

## 2011-10-19 LAB — PTT: aPTT: 25 s (ref 24.5–31.6)

## 2014-02-15 ENCOUNTER — Inpatient Hospital Stay
Admit: 2014-02-15 | Discharge: 2014-02-16 | Disposition: A | Discharge status: Home / Self Care | Payer: MEDICAID | Attending: Specialist

## 2014-02-15 DIAGNOSIS — T401X1A Poisoning by heroin, accidental (unintentional), initial encounter: Secondary | ICD-10-CM

## 2014-02-15 MED ORDER — BACITRACIN 500 UNIT/G OINTMENT
500 unit/gram | CUTANEOUS | Status: DC
Start: 2014-02-15 — End: 2014-02-15

## 2014-02-15 MED FILL — BACITRACIN 500 UNIT/G OINTMENT: 500 unit/gram | CUTANEOUS | Qty: 14

## 2014-02-15 NOTE — ED Notes (Signed)
With permission from the pt, I have made a phone call to her Mother Thelma Barge(Francis) [phone], to inform her the pt is here in our Emergency Dept and is currently stable and safe.

## 2014-02-15 NOTE — ED Notes (Signed)
Pt OD'ed on heroin. Respirations 4 per minute on the scene and sat's 70% on room air. Pt given narcan 1mg  IV and pt arrived in ED A&O*3. Pt in no distress.

## 2014-02-15 NOTE — ED Notes (Signed)
Discharge instructions given with 0 prescriptions. Pt to follow up with her Primary MD, or return if symptoms worsen. All questions were answered at this time, pt verbalized understanding. Pt condition is stable.

## 2014-02-15 NOTE — ED Provider Notes (Signed)
HPI Comments: Darien Mignogna is a 44 y.o. female who presents to the ED via EMS with complaint of heroin overdose PTA. Per EMS, patient was given  narcan IM with significant response. She states she was standing in the subways and then intranasally used heroin, fell and braced her fall with her L knee. Bystanders then called EMS. She has no complaints or pain. Denies fevers, chills, sob, cp, abdominal pain, HA, numbness, tingling, paresthesia, dizziness, lightheadedness. Her last tetanus was 2 years ago. She also complains of an abrasion to her L knee. Pain is intermittent, aching, and non-radiating. Pain is exacerbated with palpation. No alleviating factors. She has been able to ambulate and bear weight on her L knee without pain. Patient states she is currently upset at herself because she has been sober for many years.     Patient is a 44 y.o. female presenting with Ingested Medication. The history is provided by the patient and the EMS personnel.   Drug Overdose  This is a new problem. The current episode started less than 1 hour ago. The problem has been resolved. Pertinent negatives include no chest pain, no abdominal pain, no headaches and no shortness of breath. Nothing aggravates the symptoms. Nothing relieves the symptoms. Treatments tried: narcan. The treatment provided significant relief.        Past Medical History:   Diagnosis Date   ??? Seizures (HCC)        No past surgical history on file.      No family history on file.    History     Social History   ??? Marital Status: SINGLE     Spouse Name: N/A     Number of Children: N/A   ??? Years of Education: N/A     Occupational History   ??? Not on file.     Social History Main Topics   ??? Smoking status: Not on file   ??? Smokeless tobacco: Not on file   ??? Alcohol Use: Not on file   ??? Drug Use: Not on file   ??? Sexual Activity: Not on file     Other Topics Concern   ??? Not on file     Social History Narrative   ??? No narrative on file           ALLERGIES:  Review of patient's allergies indicates no known allergies.      Review of Systems   Constitutional: Negative.  Negative for fever, chills and diaphoresis.   HENT: Negative.  Negative for congestion, sinus pressure and sore throat.    Eyes: Negative.  Negative for photophobia and visual disturbance.   Respiratory: Negative.  Negative for cough, chest tightness and shortness of breath.    Cardiovascular: Negative.  Negative for chest pain and palpitations.   Gastrointestinal: Negative.  Negative for nausea, vomiting, abdominal pain, diarrhea and constipation.   Genitourinary: Negative.  Negative for dysuria, urgency and hematuria.   Musculoskeletal: Negative.  Negative for myalgias, arthralgias, neck pain and neck stiffness.   Skin: Positive for wound. Negative for rash.   Neurological: Negative.  Negative for dizziness, weakness, light-headedness, numbness and headaches.   All other systems reviewed and are negative.      Filed Vitals:    02/15/14 1701 02/15/14 1752   BP: 121/75 116/74   Pulse: 68 57   Temp: 96.6 ??F (35.9 ??C)    Resp: 18 10   Height:  (1.778 m)    Weight: 83.915 kg (185 lb)  SpO2: 96% 91%            Physical Exam   Constitutional: She is oriented to person, place, and time. She appears well-developed and well-nourished. No distress.   HENT:   Head: Normocephalic and atraumatic.   Eyes: Conjunctivae and EOM are normal. Pupils are equal, round, and reactive to light. Right eye exhibits no discharge. Left eye exhibits no discharge. No scleral icterus.   Neck: Normal range of motion. Neck supple.   Cardiovascular: Normal rate, regular rhythm and intact distal pulses.  Exam reveals no gallop and no friction rub.    No murmur heard.  Pulmonary/Chest: Effort normal and breath sounds normal. No respiratory distress. She has no wheezes. She has no rales. She exhibits no tenderness.   Abdominal: Soft. Bowel sounds are normal. She exhibits no distension. There is no tenderness. There is no rebound and  no guarding.   Musculoskeletal: Normal range of motion. She exhibits no edema or tenderness.        Left knee: She exhibits normal range of motion. No tenderness found.   Neurological: She is alert and oriented to person, place, and time.   Skin: Skin is warm and dry. Abrasion noted. She is not diaphoretic.   L knee:  2 abrasions on the patellar and right below the patellar.    Psychiatric: She has a normal mood and affect. Her behavior is normal. Thought content normal.   Nursing note and vitals reviewed.       MDM  Number of Diagnoses or Management Options  Heroin overdose, accidental or unintentional, initial encounter:   Knee abrasion, left, initial encounter:   Diagnosis management comments: 44 y.o. female who [place] subway [place]. Ems was called by bystanders, patient was brought to the ER. She is AaOx3 and upset after being clean for 2 years. No acute distress.       Plan:   Bacitracin  POC glucose  -----  6:30 PM  Documented by Adora FridgeSean Kim, acting as a scribe for Dr. Johnna AcostaWalesia L Nechelle Petrizzo, MD      PROVIDER ATTESTATION:  7:35 PM    The entirety of this note, signed by me, accurately reflects all works, treatments, procedures, and medical decision making performed by me, Johnna AcostaWalesia L Wayde Gopaul, MD.              Patient Progress  Patient progress: stable    Diagnostic Studies:      Lab Data:  Labs Reviewed   POC GLUCOSE         ED Course:         Procedures

## 2014-02-16 MED ORDER — BACITRACIN 500 UNIT/G TOPICAL PACKET
500 unit/gram | CUTANEOUS | Status: DC
Start: 2014-02-16 — End: 2014-02-15

## 2014-02-16 MED ORDER — ACETAMINOPHEN 325 MG TABLET
325 mg | ORAL | Status: AC
Start: 2014-02-16 — End: 2014-02-15
  Administered 2014-02-16: 01:00:00 via ORAL

## 2014-02-16 MED FILL — BACITRACIN 500 UNIT/G TOPICAL PACKET: 500 unit/gram | CUTANEOUS | Qty: 1

## 2014-02-16 MED FILL — TYLENOL 325 MG TABLET: 325 mg | ORAL | Qty: 2

## 2014-04-30 NOTE — Consult Note (Signed)
PATIENT NAME:  Amber Ramos, [name] MR#:  [id number] DATE OF BIRTH:  Aug 15, 1970  DATE OF CONSULTATION:  08/27/2011  REFERRING PHYSICIAN:   CONSULTING PHYSICIAN:  Audery AmelJohn T. Aadhya Bustamante, MD  IDENTIFYING INFORMATION AND REASON FOR CONSULT: A 44 year old woman admitted to the Emergency Room voluntarily requesting detox. Consult for evaluation of psychiatric needs.   HISTORY OF PRESENT ILLNESS: Information obtained from the patient and the chart. She came in last night intoxicated at the time and appeared to be somewhat disorganized and odd in her thinking, and a little incoherent in her presentation. She was with a friend last night, and they were both requesting detox admission. They said that they had been to [facility] and had been told they needed to come to our facility first.  The patient had labs drawn and had an alcohol level of 200, and a drug screen positive for benzodiazepines. She was kept and admitted as a patient, although her boyfriend evidently was not admitted as a patient and has on his own gone back to [facility].  The patient tells me that she drinks only occasionally. Burgess EstelleYesterday was an unusual experience of drinking. She says mostly she was taking her Xanax yesterday, took three Xanax which is more than her normal dose. She says that her main concern is with opiate use. She uses pills and nasal heroin and has been doing so at an escalating rate. She feels that she needs detox to stay clean. She has not used any, however, in the last several days since she has been traveling to [place].  Her current mood is fine, a little bit anxious. Denies any suicidal or homicidal ideation. Denies any hallucinations or delusions. She is not behaving in a self injuring or aggressive manner. She states that she gets regular psychiatric treatment and has been diagnosed with depression and PTSD in the past.  She says her current medications are Wellbutrin, Lexapro, Xanax, and a small dose of Lamictal which she gets from a  psychiatrist in [place].  She has no interest in trying to detox or come off of the Xanax and says that she does not think she regularly abuses it. Her current request is to go to [facility].   PAST PSYCHIATRIC HISTORY: Denies ever having been in a psychiatric hospital except once as a detox. She says that at one time she was diagnosed as bipolar at the hospital but has not been diagnosed as bipolar in any other circumstances. She says her regular psychiatric diagnosis is PTSD. She denies any history of psychotic symptoms. Denies any suicide attempts. Denies being physically violent except she says when she is drunk. She says she is compliant with her current medication.   PAST MEDICAL HISTORY: The patient has irritable bowel syndrome; that is her only other medical complaint. No significant ongoing medical problems.   SOCIAL HISTORY: The patient is in an unusual and awkward situation. She regularly lives in [place] but seems to have impulsively decided to leave [place] and come down to [place] to be with a friend of hers. They were originally in [place], trying to get into a shelter or get substance abuse treatment there but were thrown out [facility] for aggressive behavior towards each other. The patient tells an odd but plausible story of how then various Police and Sheriff's helped her to transport from her [place] up to [place] to reunite with her boyfriend and get her car back. They then drove down to [place] hoping to come into [facility].  She is not from [place] and has no place to stay here. She has no money. She has a Merchant navy officer but it does not have any gas in it. She says she is married, but she is estranged from her husband and trying to leave him. She has a young child, but that child has now returned back to [place] and is with the patient's parents.   SUBSTANCE ABUSE HISTORY: Says that she has recently had an escalating problem with opiates. Only recently started using  heroin, does not shoot IV, uses intranasally. Has not used any in about a week. Says she drinks only occasionally. Denies that she abuses her prescription drugs.   REVIEW OF SYSTEMS: Denies any acute mood symptoms. Denies psychosis. Denies suicidal or homicidal ideation. Denies feeling physically shaky. Says that she is aching and feeling like she is having opiate withdrawal and has been having diarrhea.   MENTAL STATUS EXAMINATION: A somewhat disheveled woman interviewed in an Emergency Room. She wakes up readily and is cooperative with the interview. Eye contact is good. Psychomotor activity is animated but not bizarre or agitated. Speech is a little bit louder in tone than average but not shouting and not aggressive. Sometimes her story gets a little intricate, but she does not appear to be loose in her associations or actually tangential. Thoughts overall appear to be lucid and organized. No evidence of grandiose or bizarre thinking. No evidence of delusions. Denies hallucinations. Denies suicidal or homicidal ideation. Appears to be of average intelligence. Recent judgment questionable but not psychotic. Insight adequate. Short-term and long-term memory appear grossly intact. No evidence of cognitive deficits.   ASSESSMENT: This is a 44 year old woman who came to the hospital last night in the midst of what sounds like a rather confusing and chaotic set of circumstances, trying to get admission for detox. It is not altogether clear to me whether it has really all about detox or about having a place to stay. In any case, she was refused admission at [facility] because she presented with a man who was presumed to be her boyfriend and they would not admit both of them. The patient herself denies that they are actually in a romantic relationship, but nevertheless she is not going to be admitted to [facility]. She does not have any indication for psychiatric admission. She is not under involuntary commitment and does not  meet commitment criteria. She is not showing symptoms right now that would suggest a need for forced medical detox from alcohol.  At this point it appears that her primary need is for a place to stay. She has already mentioned to me that she is willing to find out about any local resources so that hopefully she can get into a shelter or some other place to stay. She does not need any further treatment in the Emergency Room.   On a somewhat unrelated issue, patient was originally reported as being a "hermaphrodite" by the assessment nurse last night. The patient is extremely masculine looking for a woman. I did not do a full physical exam and cannot confirm genitalia. The patient herself tells me that she is a natural born female who used to be a Pharmacist, community and takes steroids, and is naturally just very masculine looking. We will leave it at that.   DIAGNOSIS PRINCIPLE AND PRIMARY:  AXIS I: 1. Alcohol abuse.  2. Opiate abuse.  SECONDARY DIAGNOSES: AXIS I: Depression, not otherwise specified. Rule out posttraumatic stress disorder.   AXIS II: Deferred.  AXIS III: No diagnosis.      AXIS IV: Moderate to severe stress from being far away from home with no money. No gas and  no place to stay.   AXIS V: Functioning at time of evaluation 60.     ____________________________ Audery Amel, MD [id number] D: 08/27/2011 12:51:16 ET T: 08/27/2011 13:28:44 ET JOB#: [id number]  cc: Audery Amel, MD, <Dictator> Audery Amel MD ELECTRONICALLY SIGNED 08/27/2011 16:28

## 2014-04-30 NOTE — Consult Note (Signed)
Brief Consult Note: Diagnosis: alcohol abuse, opiate abuse.   Patient was seen by consultant.   Consult note dictated.   Discussed with Attending MD.   Comments: Psychiatry: Patient seen. Was intoxicated last night. Currently sober. Denies any acute psychiatric symptoms. No medical need of acute medical detox. Requests [facility]. [facility]. Suggest ER work on either referral to [facility] or discharge. Note dictated.  Electronic Signatures: Audery Amellapacs, Gerrard Crystal T (MD)  (Signed 16-Aug-13 12:41)  Authored: Brief Consult Note   Last Updated: 16-Aug-13 12:41 by Audery Amellapacs, Kaileah Shevchenko T (MD)

## 2014-12-24 ENCOUNTER — Emergency Department: Admit: 2014-12-24 | Payer: MEDICAID | Primary: Specialist

## 2014-12-24 ENCOUNTER — Inpatient Hospital Stay
Admit: 2014-12-24 | Discharge: 2014-12-25 | Disposition: A | Discharge status: Home / Self Care | Payer: MEDICAID | Attending: Emergency Medicine

## 2014-12-24 DIAGNOSIS — T401X1A Poisoning by heroin, accidental (unintentional), initial encounter: Secondary | ICD-10-CM

## 2014-12-24 LAB — METABOLIC PANEL, COMPREHENSIVE
A-G Ratio: 1 (ref 1.0–3.1)
ALT (SGPT): 30 U/L (ref 12.0–78.0)
AST (SGOT): 31 U/L (ref 15–37)
Albumin: 3 g/dL — ABNORMAL LOW (ref 3.40–5.00)
Alk. phosphatase: 68 U/L (ref 46–116)
Anion gap: 14 mmol/L (ref 10–17)
BUN/Creatinine ratio: 10 (ref 6.0–20.0)
BUN: 12 MG/DL (ref 7–18)
Bilirubin, total: 0.3 MG/DL (ref 0.20–1.00)
CO2: 27 mmol/L (ref 21–32)
Calcium: 8.3 MG/DL — ABNORMAL LOW (ref 8.5–10.1)
Chloride: 101 mmol/L (ref 98–107)
Creatinine: 1.15 MG/DL (ref 0.6–1.3)
GFR est AA: >60 mL/min/{1.73_m2} (ref >60)
GFR est non-AA: 55 mL/min/{1.73_m2} — ABNORMAL LOW (ref >60)
Globulin: 3 g/dL
Glucose: 78 mg/dL (ref 74–106)
Potassium: 4.1 mmol/L (ref 3.50–5.10)
Protein, total: 6 g/dL — ABNORMAL LOW (ref 6.40–8.20)
Sodium: 138 mmol/L (ref 136–145)

## 2014-12-24 LAB — CBC WITH AUTOMATED DIFF
ABS. BASOPHILS: 0 10*3/uL (ref 0.0–0.2)
ABS. EOSINOPHILS: 0.3 10*3/uL (ref 0.0–0.7)
ABS. LYMPHOCYTES: 1.8 10*3/uL (ref 1.2–3.4)
ABS. MONOCYTES: 0.3 10*3/uL — ABNORMAL LOW (ref 1.1–3.2)
ABS. NEUTROPHILS: 5.8 10*3/uL (ref 1.4–6.5)
BASOPHILS: 1 % (ref 0–2)
EOSINOPHILS: 4 % (ref 0–5)
HCT: 36.2 % (ref 34.0–42.2)
HGB: 12.1 g/dL (ref 11.8–14.2)
IMMATURE GRANULOCYTES: 0.4 % (ref 0.0–5.0)
LYMPHOCYTES: 22 % (ref 16–40)
MCH: 31.5 PG — ABNORMAL HIGH (ref 27–31)
MCHC: 33.4 g/dL (ref 32–36)
MCV: 94.3 FL (ref 81–99)
MONOCYTES: 3 % (ref 0–12)
MPV: 10.2 FL (ref 7.4–10.4)
NEUTROPHILS: 70 % (ref 40–70)
PLATELET: 214 10*3/uL (ref 140–450)
RBC: 3.84 M/uL — ABNORMAL LOW (ref 4.0–5.2)
RDW: 12.8 % (ref 11.5–14.5)
WBC: 8.3 10*3/uL (ref 4.8–10.8)

## 2014-12-24 LAB — ETHYL ALCOHOL: ALCOHOL(ETHYL),SERUM: <3 MG/DL (ref <3.0)

## 2014-12-24 LAB — MAGNESIUM: Magnesium: 1.6 mg/dL — ABNORMAL LOW (ref 1.8–2.4)

## 2014-12-24 MED ORDER — OXYCODONE 5 MG TAB
5 mg | ORAL | Status: AC
Start: 2014-12-24 — End: 2014-12-24
  Administered 2014-12-24: 22:00:00 via ORAL

## 2014-12-24 MED ORDER — BACITRACIN 500 UNIT/G TOPICAL PACKET
500 unit/gram | Freq: Three times a day (TID) | CUTANEOUS | Status: DC
Start: 2014-12-24 — End: 2014-12-24

## 2014-12-24 MED ORDER — SODIUM CHLORIDE 0.9% BOLUS IV
0.9 % | Freq: Once | INTRAVENOUS | Status: AC
Start: 2014-12-24 — End: 2014-12-24
  Administered 2014-12-24: 21:00:00 via INTRAVENOUS

## 2014-12-24 MED ORDER — SODIUM CHLORIDE 0.9 % IJ SYRG
INTRAMUSCULAR | Status: DC | PRN
Start: 2014-12-24 — End: 2014-12-24
  Administered 2014-12-24 (×3): via INTRAVENOUS

## 2014-12-24 MED FILL — OXYCODONE 5 MG TAB: 5 mg | ORAL | Qty: 1

## 2014-12-24 MED FILL — SODIUM CHLORIDE 0.9 % IV: INTRAVENOUS | Qty: 1000

## 2014-12-24 NOTE — ED Notes (Signed)
Discharge instructions given with 0 prescriptions. Pt to follow up with her Primary MD, or return if symptoms worsen. All questions were answered at this time, pt verbalized understanding. Pt condition is stable.

## 2014-12-24 NOTE — ED Notes (Signed)
Pt reports her Mother died 4 days ago, and Her Father Died around Christmas time several years ago. Pt feeling depressed but not SI, HI or A/V Hallucinations. Pt decided to take a tablet of Dope. She passed out. Right Shoulder Repair Nov 20. Stitches just out. Now c/o pain to the Right Shoulder after her fall today. Pt is stable. Currently on tele/bp/O2 monitoring. Placed into a bear hugger heating system as she was shivering upon arrival. Her hands are cold and difficult to obtain a pulse ox.

## 2014-12-24 NOTE — ED Triage Notes (Signed)
Pt presents with EMS. Found apneic and cyanotic face down on ground. Per EMS full body contracted. Narcan 2mg  given IV. Pt responded immediately and was awake, alert, oriented. Hx of seizures though no postictal period noted. Pt awake, alert, c-collar in place. Incontinent of bowel.

## 2014-12-24 NOTE — ED Provider Notes (Signed)
HPI Comments: Colleen Davenport is a 44 y.o. female who was brought into the ER for unresponsiveness likely 2/2 drug overdose. The patient was noted to be face down and unresponsive by bystanders who called 911. EMS arrived on scene and found patient face down, cyanotic. Pt had pulse. C collar placed for facial abrasion and pt noted to be rigid in the upper extremities. Her pupils were pinpoint and she was given narcan  with immediate response, becoming awake, alert and oriented. Pt admits to "dope". Hx of seizures, medics did not witness seizure like activity.     Pt admits that mother died 4 days ago, father died during the holiday season 15 years ago. She always has increased sadness over the holidays - she frankly "doesnt like them". She decided she wanted a "few hours of peace" and states it was "a bad idea". She used heroin ("dope") a few times in college, and thought she would try it again today. She denies SI/HI, AH/VH.       Patient is a 44 y.o. female presenting with Ingested Medication. The history is provided by the EMS personnel and the patient.   Drug Overdose   Pertinent negatives include no chest pain, no abdominal pain and no shortness of breath.        Past Medical History:   Diagnosis Date   ??? Deafness in left ear    ??? Depression    ??? Heroin abuse    ??? Seizures (HCC)        Past Surgical History:   Procedure Laterality Date   ??? Pr cardiac surg procedure unlist       Angina   ??? Hx rotator cuff repair Right 2016   ??? Hx cesarean section     ??? Hx hernia repair           History reviewed. No pertinent family history.    Social History     Social History   ??? Marital status: SINGLE     Spouse name: N/A   ??? Number of children: N/A   ??? Years of education: N/A     Occupational History   ??? Not on file.     Social History Main Topics   ??? Smoking status: Never Smoker   ??? Smokeless tobacco: Never Used   ??? Alcohol use Yes   ??? Drug use: Yes     Special: Heroin   ??? Sexual activity: No     Other Topics Concern   ???  Not on file     Social History Narrative     ALLERGIES: Review of patient's allergies indicates no known allergies.    Review of Systems   Constitutional: Negative.  Negative for chills and fever.   HENT: Negative.  Negative for congestion, rhinorrhea and sore throat.    Eyes: Negative.  Negative for visual disturbance.   Respiratory: Negative.  Negative for cough and shortness of breath.    Cardiovascular: Negative.  Negative for chest pain and leg swelling.   Gastrointestinal: Negative.  Negative for abdominal pain, constipation, diarrhea, nausea and vomiting.   Genitourinary: Negative.  Negative for dysuria, frequency and hematuria.   Musculoskeletal: Positive for arthralgias. Negative for gait problem.   Skin: Negative.  Negative for rash.        No pruritis   Neurological: Negative.  Negative for dizziness, speech difficulty, weakness and numbness.   All other systems reviewed and are negative.      Vitals:    12/24/14  1518 12/24/14 1530   BP: 113/73 99/66   Pulse: 84 88   Resp: 12 14   Temp: 99.6 ??F (37.6 ??C)    SpO2: 93% 91%   Weight: 79.4 kg (175 lb)    Height: 5\' 5"  (1.651 m)             Physical Exam   Constitutional: She is oriented to person, place, and time. She appears well-developed and well-nourished. No distress.   HENT:   Head: Normocephalic.   Superficial abrasion over bridge of nose and scattered across forehead   Eyes: Conjunctivae and EOM are normal. Pupils are equal, round, and reactive to light. Right eye exhibits no discharge. Left eye exhibits no discharge. No scleral icterus.   Neck: Normal range of motion. Neck supple. No tracheal deviation present.   Cardiovascular: Normal rate, regular rhythm, normal heart sounds and intact distal pulses.  Exam reveals no gallop and no friction rub.    No murmur heard.  Pulmonary/Chest: Effort normal and breath sounds normal. No respiratory distress. She has no wheezes. She has no rales.   Abdominal: Soft. Bowel sounds are normal. She exhibits no  distension. There is no tenderness. There is no rebound and no guarding.   Musculoskeletal: Normal range of motion. She exhibits tenderness.   Mild tenderness over right shoulder  well healing incision  no obvious deformites   Neurological: She is alert and oriented to person, place, and time.   Hearing related speech impediment   Skin: Skin is warm and dry. No rash noted. She is not diaphoretic. No erythema.   Psychiatric: She has a normal mood and affect. Her behavior is normal. Judgment and thought content normal.   Nursing note and vitals reviewed.       MDM  Number of Diagnoses or Management Options  Diagnosis management comments: 44 y.o. female with hx of seizures on lamictal, depression, and s/p overdose responsive to narcan in the setting of depression and recent loss of her mother. Pt denies any intentional or suicidal thoughts.      Plan:     Labs  CT Head wo cont  CT C-spine wo cont  XR of right shoulder  Oxycodone PO for shoulder pain    -----  4:48 PM  Documented by Leeroy ChaMatthew Liu, acting as a scribe for Dr. Idamae LusherKimberly A Tracy Kinner, MD      PROVIDER ATTESTATION:  4:56 PM    The entirety of this note, signed by me, accurately reflects all works, treatments, procedures, and medical decision making performed by me, Idamae LusherKimberly A Sayana Salley, MD.      Patient Progress  Patient progress: stable    ED Course     Diagnostic Studies:    CT Scan 6:24 PM  [x]  Head []  C-spine []  Chest  []   Abd/Pelvis  []   Other:   [x]  Viewed by me   []  Interpreted by me  [x]  No acute disease  []  Abnormal: see below []   Discussed with Radiologist     Interpretation: No abnormality.    CT Scan 6:24 PM  []  Head [x]  C-spine []  Chest  []   Abd/Pelvis  []   Other:   [x]  Viewed by me   []  Interpreted by me  [x]  No acute disease  []  Abnormal: see below []   Discussed with Radiologist     Interpretation: No fracture, dislocation, subluxation.    XRAY 4:30 PM    []  Left [x]  Right    []  Fingers  []  Hand  []   Wrist  []  Forearm  []  Elbow  []  Humerus  [x]   Shoulder    []  Toes  []  Foot  []  Ankle  []  Tib/fib  []  Knee  []  Femur  []  Hip  []  Pelvis   []  Cervical Spine  []  Thoracic Spine  []  Lumbar Spine  []  Sacrum  []  Ribs        [x]  Viewed by me    [x]  Interpreted by me  [x]  No fracture/dislocation  []  Abnormal : see below   []  Discussed with       Radiologist      Interpretation: no acute injury/abnormality      Lab Data:  Labs Reviewed   METABOLIC PANEL, COMPREHENSIVE - Abnormal; Notable for the following:        Result Value    GFR est non-AA 55 (*)     Calcium 8.3 (*)     Protein, total 6.0 (*)     Albumin 3.0 (*)     All other components within normal limits   CBC WITH AUTOMATED DIFF - Abnormal; Notable for the following:     RBC 3.84 (*)     MCH 31.5 (*)     ABS. MONOCYTES 0.3 (*)     All other components within normal limits   MAGNESIUM - Abnormal; Notable for the following:     Magnesium 1.6 (*)     All other components within normal limits   ETHYL ALCOHOL   DRUG SCREEN, URINE   URINALYSIS W/ RFLX MICROSCOPIC   HCG URINE, QL       ED Course:   6:30 PM  I clinically cleared pt's C collar. Pt has no midline tenderness, no pain with ROM. Pt is requesting water. Will ambulate and discharge if pt is okay.    Procedures

## 2014-12-24 NOTE — ED Notes (Signed)
Dr. Karma GreaserBoswell has cleared the pt from her C-Collar. Tech Meriam SpragueBeverly has cleaned the wounds on the pt's face. She is stable and pending discharge. Pt will need a shirt and Jacket as they were cut off by EMS during the initial call.

## 2014-12-24 NOTE — ED Notes (Signed)
This is a back timed note: pt reporting pain to her right shoulder. Dr. Karma GreaserBoswell is aware and has placed new orders. Pt remains stable, VS are WNL.
